# Patient Record
Sex: Male | Born: 1995 | Race: Black or African American | Hispanic: No | Marital: Single | Smoking: Never smoker
Health system: Southern US, Community
[De-identification: ages and names within clinical notes are randomized; demographics above are authoritative.]

## PROBLEM LIST (undated history)

## (undated) DIAGNOSIS — J45909 Unspecified asthma, uncomplicated: Secondary | ICD-10-CM

---

## 2016-07-07 ENCOUNTER — Encounter (HOSPITAL_COMMUNITY): Payer: Self-pay

## 2016-07-07 ENCOUNTER — Emergency Department (HOSPITAL_COMMUNITY): Payer: Medicaid Other

## 2016-07-07 ENCOUNTER — Emergency Department (HOSPITAL_COMMUNITY)
Admission: EM | Admit: 2016-07-07 | Discharge: 2016-07-08 | Disposition: A | Discharge status: Home / Self Care | Payer: Medicaid Other | Attending: Emergency Medicine | Admitting: Emergency Medicine

## 2016-07-07 DIAGNOSIS — Z79899 Other long term (current) drug therapy: Secondary | ICD-10-CM | POA: Insufficient documentation

## 2016-07-07 DIAGNOSIS — Z791 Long term (current) use of non-steroidal anti-inflammatories (NSAID): Secondary | ICD-10-CM | POA: Insufficient documentation

## 2016-07-07 DIAGNOSIS — R079 Chest pain, unspecified: Secondary | ICD-10-CM | POA: Diagnosis present

## 2016-07-07 DIAGNOSIS — J45909 Unspecified asthma, uncomplicated: Secondary | ICD-10-CM | POA: Diagnosis not present

## 2016-07-07 DIAGNOSIS — R0789 Other chest pain: Secondary | ICD-10-CM | POA: Insufficient documentation

## 2016-07-07 HISTORY — DX: Unspecified asthma, uncomplicated: J45.909

## 2016-07-07 LAB — BASIC METABOLIC PANEL
ANION GAP: 6 (ref 5–15)
BUN: 22 mg/dL — ABNORMAL HIGH (ref 6–20)
CO2: 27 mmol/L (ref 22–32)
Calcium: 9 mg/dL (ref 8.9–10.3)
Chloride: 106 mmol/L (ref 101–111)
Creatinine, Ser: 1.12 mg/dL (ref 0.61–1.24)
GLUCOSE: 139 mg/dL — AB (ref 65–99)
POTASSIUM: 3.9 mmol/L (ref 3.5–5.1)
Sodium: 139 mmol/L (ref 135–145)

## 2016-07-07 LAB — I-STAT TROPONIN, ED: TROPONIN I, POC: 0.01 ng/mL (ref 0.00–0.08)

## 2016-07-07 LAB — CBC
HEMATOCRIT: 41.4 % (ref 39.0–52.0)
HEMOGLOBIN: 13.7 g/dL (ref 13.0–17.0)
MCH: 28.2 pg (ref 26.0–34.0)
MCHC: 33.1 g/dL (ref 30.0–36.0)
MCV: 85.2 fL (ref 78.0–100.0)
Platelets: 265 10*3/uL (ref 150–400)
RBC: 4.86 MIL/uL (ref 4.22–5.81)
RDW: 13.2 % (ref 11.5–15.5)
WBC: 7.6 10*3/uL (ref 4.0–10.5)

## 2016-07-07 LAB — I-STAT CG4 LACTIC ACID, ED: LACTIC ACID, VENOUS: 1.41 mmol/L (ref 0.5–1.9)

## 2016-07-07 MED ORDER — IBUPROFEN 600 MG PO TABS: 600.0000 mg | ORAL_TABLET | Freq: Four times a day (QID) | ORAL | 0 refills | Status: AC | PRN

## 2016-07-07 NOTE — ED Provider Notes (Signed)
[facility]-EMERGENCY DEPT Provider Note   CSN: [id number] Arrival date & time: 07/07/16  1839     History   Chief Complaint Chief Complaint  Patient presents with  . Chest Pain    HPI Kevin Kent is a 20 y.o. male.  20 year old male presents with diffuse chest and back pain times several weeks. Pain is Sharp and worse with taking deep breath. Patient also has worsening symptoms when he lifts weights or when he is running. Has a history of asthma and use his inhaler without relief. Denies any syncope or near-syncope. No dizziness. No lightheadedness. No associated diaphoresis. No treatment used prior to arrival    Past Medical History:  Diagnosis Date  . Asthma     There are no active problems to display for this patient.   History reviewed. No pertinent surgical history.     Home Medications    Prior to Admission medications   Medication Sig Start Date End Date Taking? Authorizing Provider  beclomethasone (QVAR) 40 MCG/ACT inhaler Inhale 2 puffs into the lungs 2 (two) times daily.    Yes Historical Provider, MD  fluticasone (FLONASE) 50 MCG/ACT nasal spray Place 1 spray into both nostrils daily.   Yes Historical Provider, MD  montelukast (SINGULAIR) 10 MG tablet Take 10 mg by mouth daily.   Yes Historical Provider, MD    Family History History reviewed. No pertinent family history.  Social History Social History  Substance Use Topics  . Smoking status: Never Smoker  . Smokeless tobacco: Never Used  . Alcohol use No     Allergies   Review of patient's allergies indicates no known allergies.   Review of Systems Review of Systems  All other systems reviewed and are negative.    Physical Exam Updated Vital Signs BP 108/77 (BP Location: Left Arm)   Pulse 101   Temp 98.1 F (36.7 C) (Oral)   Resp 20   Ht 5\' 6"  (1.676 m)   Wt 80.5 kg   SpO2 100%   BMI 28.63 kg/m   Physical Exam  Constitutional: He is oriented to person, place, and time. He appears  well-developed and well-nourished.  Non-toxic appearance. No distress.  HENT:  Head: Normocephalic and atraumatic.  Eyes: Conjunctivae, EOM and lids are normal. Pupils are equal, round, and reactive to light.  Neck: Normal range of motion. Neck supple. No tracheal deviation present. No thyroid mass present.  Cardiovascular: Normal rate, regular rhythm and normal heart sounds.  Exam reveals no gallop.   No murmur heard. Pulmonary/Chest: Effort normal and breath sounds normal. No stridor. No respiratory distress. He has no decreased breath sounds. He has no wheezes. He has no rhonchi. He has no rales.  Abdominal: Soft. Normal appearance and bowel sounds are normal. He exhibits no distension. There is no tenderness. There is no rebound and no CVA tenderness.  Musculoskeletal: Normal range of motion. He exhibits no edema or tenderness.  Neurological: He is alert and oriented to person, place, and time. He has normal strength. No cranial nerve deficit or sensory deficit. GCS eye subscore is 4. GCS verbal subscore is 5. GCS motor subscore is 6.  Skin: Skin is warm and dry. No abrasion and no rash noted.  Psychiatric: He has a normal mood and affect. His speech is normal and behavior is normal.  Nursing note and vitals reviewed.    ED Treatments / Results  Labs (all labs ordered are listed, but only abnormal results are displayed) Labs Reviewed  BASIC METABOLIC PANEL -  Abnormal; Notable for the following:       Result Value   Glucose, Bld 139 (*)    BUN 22 (*)    All other components within normal limits  CBC  I-STAT TROPOININ, ED  I-STAT CG4 LACTIC ACID, ED    EKG  EKG Interpretation  Date/Time:  Tuesday July 07 2016 20:23:01 EDT Ventricular Rate:  57 PR Interval:    QRS Duration: 87 QT Interval:  395 QTC Calculation: 385 R Axis:   72 Text Interpretation:  Sinus arrhythmia RSR' in V1 or V2, probably normal variant LVH by voltage Confirmed by Freida BusmanALLEN  MD, Mikyah Alamo ([id number]) on  07/07/2016 11:49:30 PM       Radiology Dg Chest 2 View  Result Date: 07/07/2016 CLINICAL DATA:  Shortness of breath, chest pain. EXAM: CHEST  2 VIEW COMPARISON:  None. FINDINGS: The heart size and mediastinal contours are within normal limits. Both lungs are clear. No pneumothorax or pleural effusion is noted. The visualized skeletal structures are unremarkable. IMPRESSION: No active cardiopulmonary disease. Electronically Signed   By: Lupita RaiderJames  Green Jr, M.D.   On: 07/07/2016 21:52    Procedures Procedures (including critical care time)  Medications Ordered in ED Medications - No data to display   Initial Impression / Assessment and Plan / ED Course  I have reviewed the triage vital signs and the nursing notes.  Pertinent labs & imaging results that were available during my care of the patient were reviewed by me and considered in my medical decision making (see chart for details).  Clinical Course    Patient's EKG does show LVH but shows no sign of QT prolongation. His laboratory studies are reassuring. Chest x-ray without signs of cardiomegaly. Do not think this represents ACS or structural heart issue. We'll however give referral to cardiology for outpatient echocardiogram  Final Clinical Impressions(s) / ED Diagnoses   Final diagnoses:  None    New Prescriptions New Prescriptions   No medications on file     Lorre NickAnthony Jonique Kulig, MD 07/07/16 2351

## 2016-07-07 NOTE — ED Triage Notes (Signed)
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 He is also short of breath on exertion and has a hx of asthma

## 2017-06-09 IMAGING — CR DG CHEST 2V
2 series · 2 of 2 positions shown · non-contrast
Comparison: None.

CLINICAL DATA: Shortness of breath, chest pain.

EXAM:
CHEST  2 VIEW

[w chest pa]
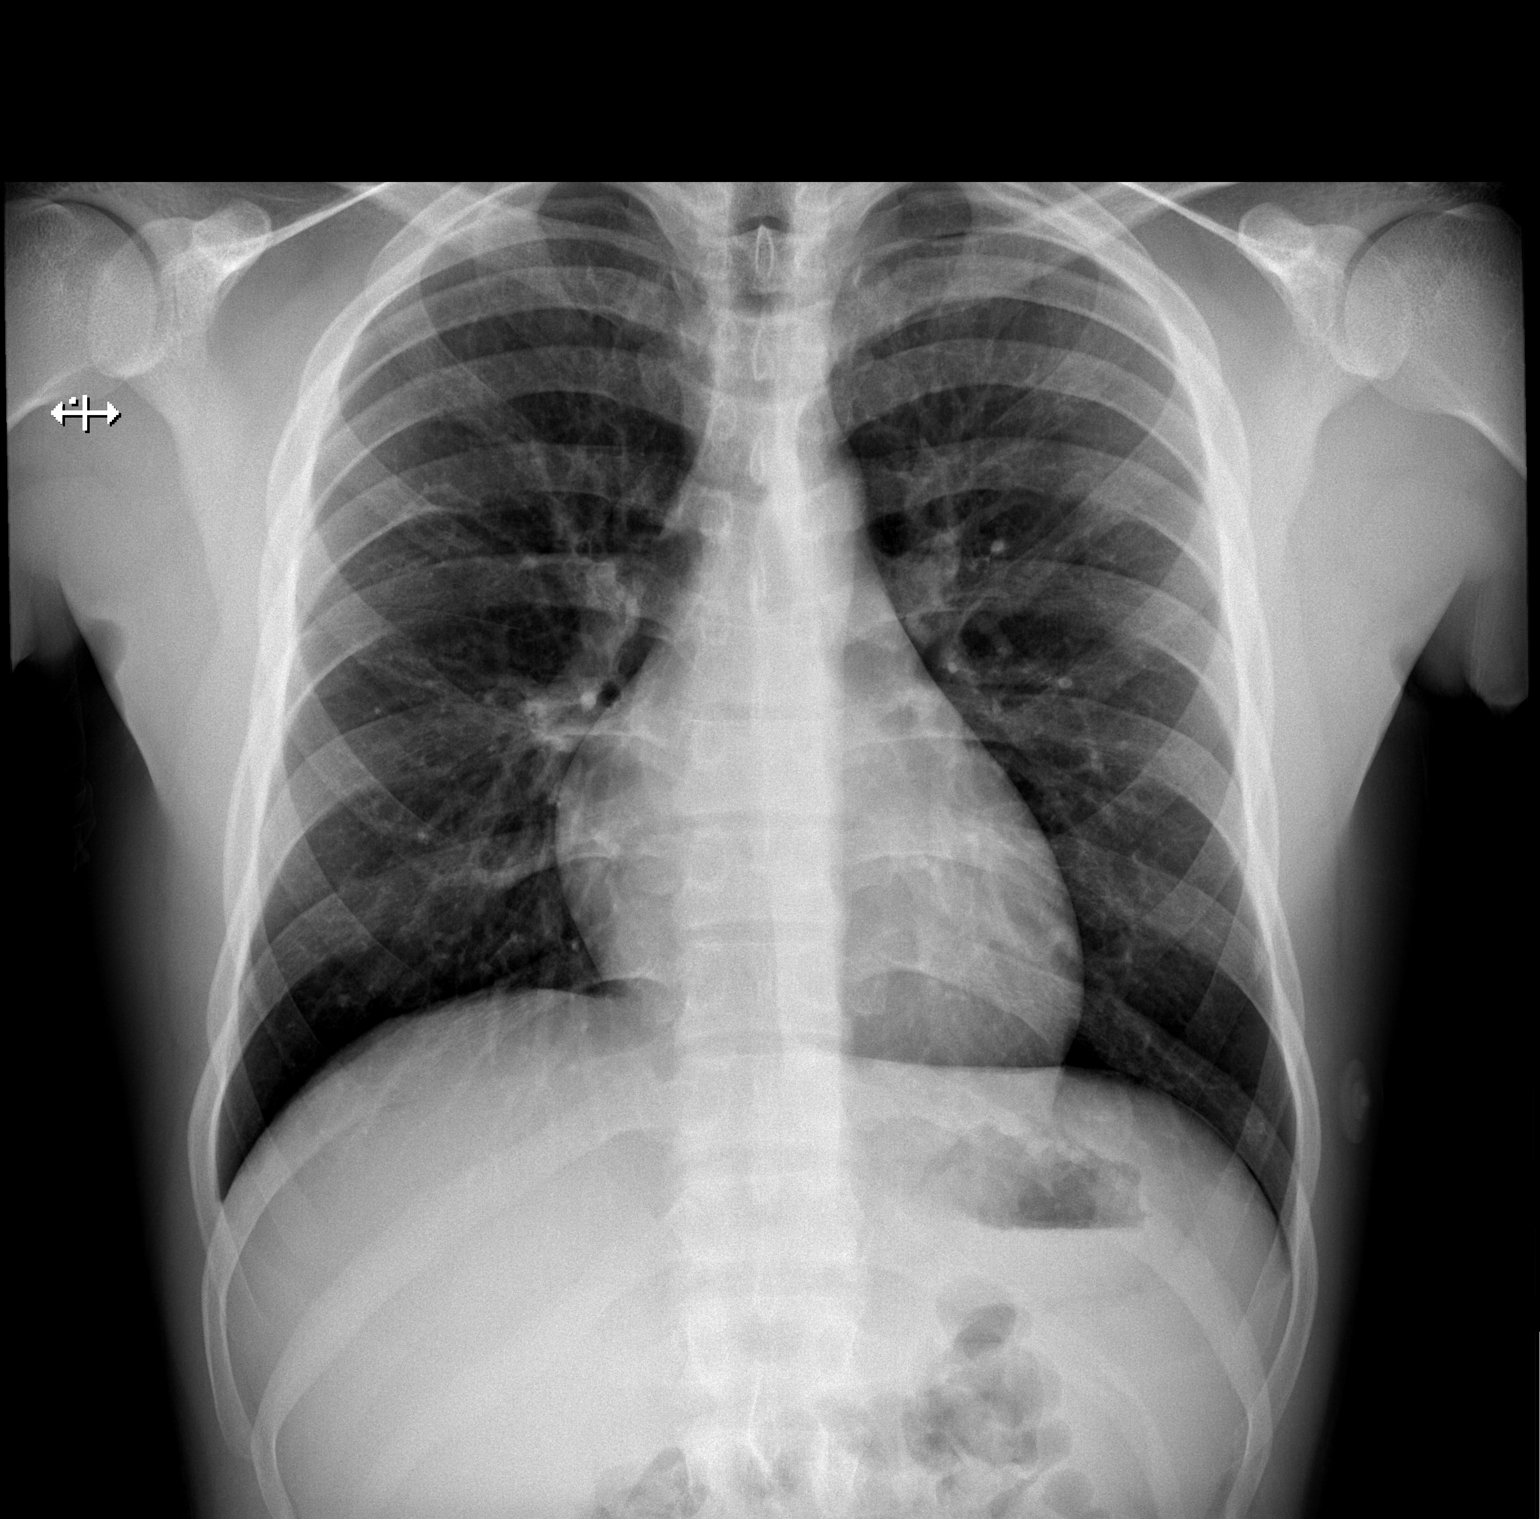

[w chest lat]
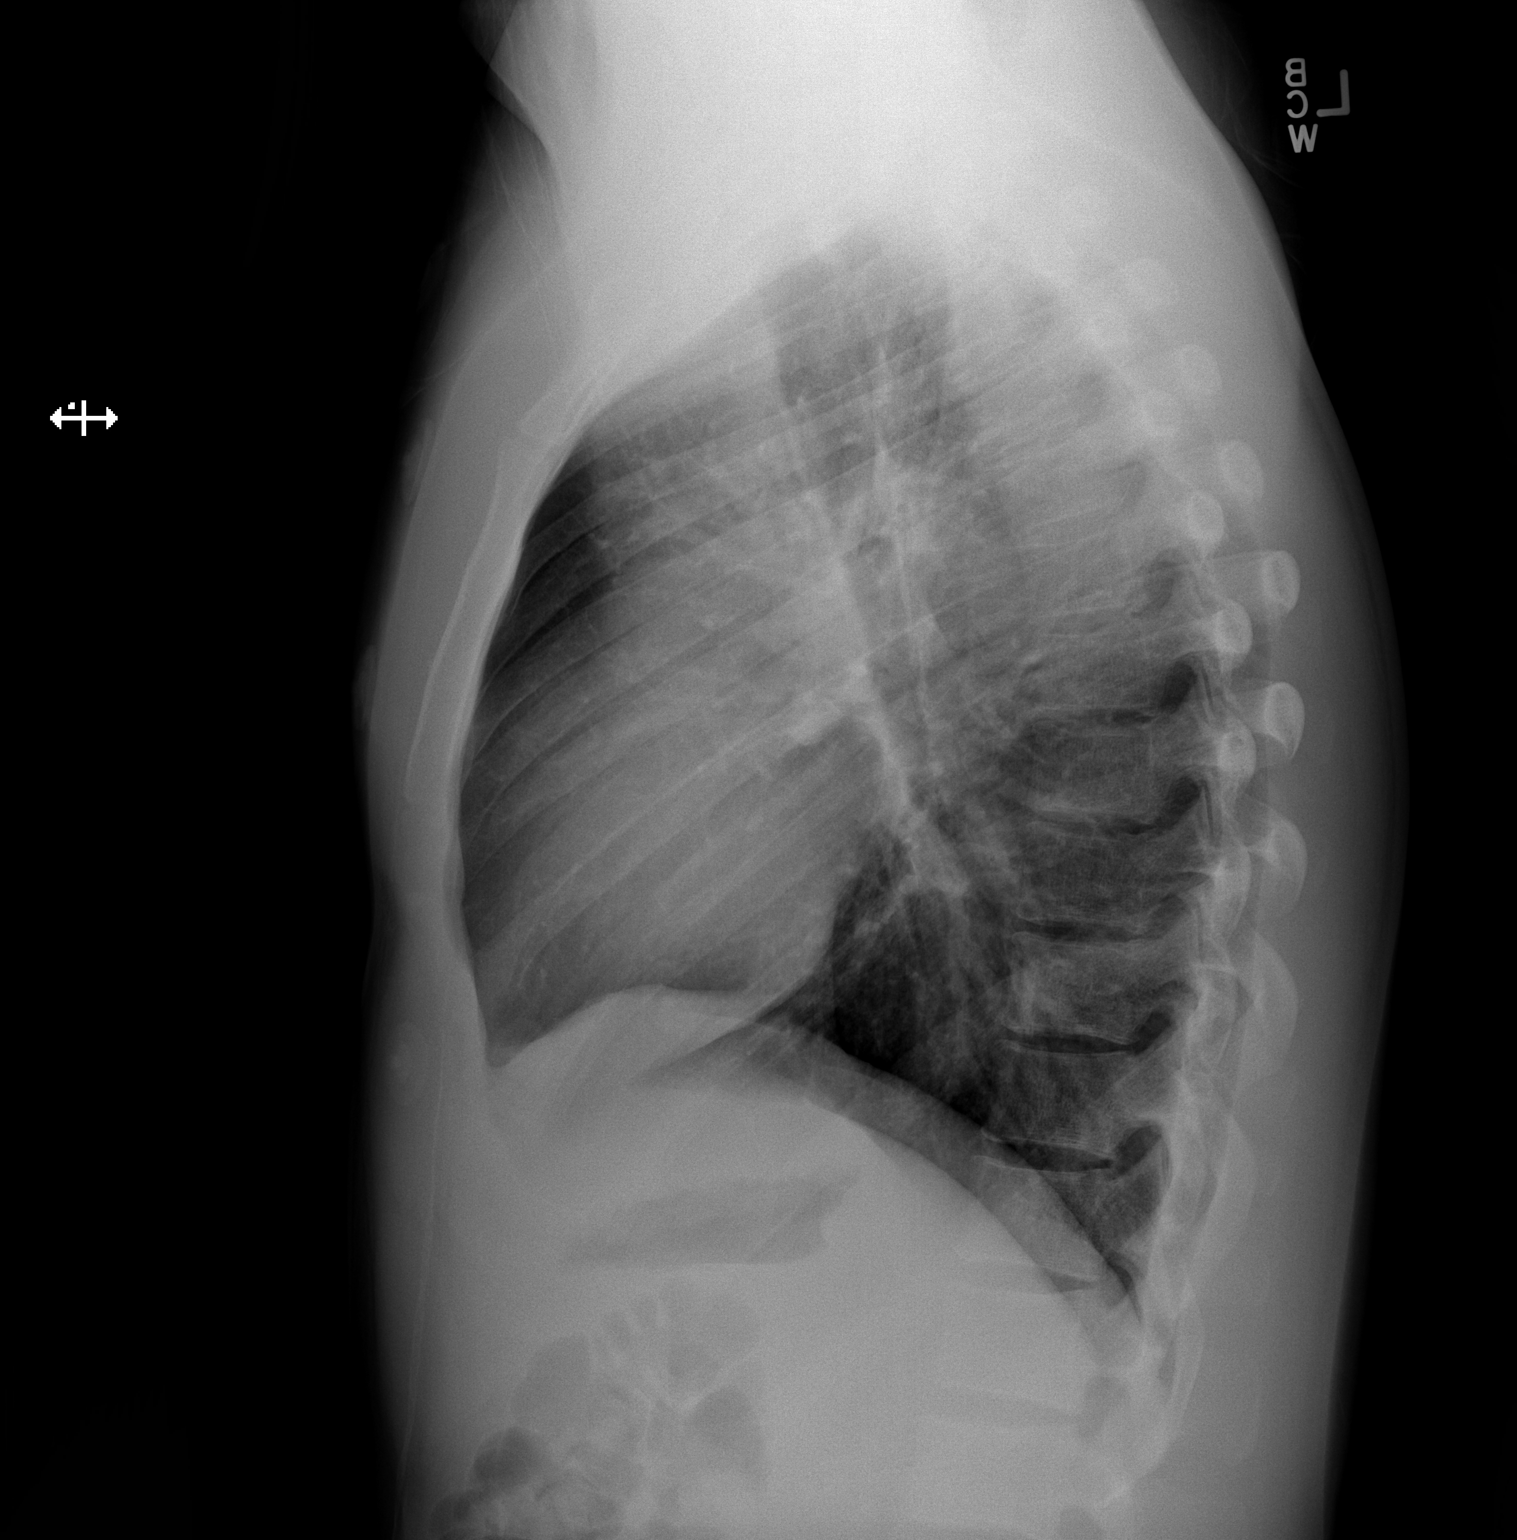

[2 of 2 positions shown; findings below may reference images not displayed]

FINDINGS: The heart size and mediastinal contours are within normal limits.
Both lungs are clear. No pneumothorax or pleural effusion is noted.
The visualized skeletal structures are unremarkable.
IMPRESSION: No active cardiopulmonary disease.
